# Patient Record
Sex: Female | Born: 1995
Health system: Southern US, Community
[De-identification: ages and names within clinical notes are randomized; demographics above are authoritative.]

## PROBLEM LIST (undated history)

## (undated) DIAGNOSIS — G43909 Migraine, unspecified, not intractable, without status migrainosus: Secondary | ICD-10-CM

## (undated) DIAGNOSIS — D561 Beta thalassemia: Secondary | ICD-10-CM

---

## 2016-06-23 ENCOUNTER — Emergency Department (HOSPITAL_BASED_OUTPATIENT_CLINIC_OR_DEPARTMENT_OTHER): Payer: Medicaid Other

## 2016-06-23 ENCOUNTER — Encounter (HOSPITAL_BASED_OUTPATIENT_CLINIC_OR_DEPARTMENT_OTHER): Payer: Self-pay

## 2016-06-23 ENCOUNTER — Emergency Department (HOSPITAL_BASED_OUTPATIENT_CLINIC_OR_DEPARTMENT_OTHER)
Admission: EM | Admit: 2016-06-23 | Discharge: 2016-06-23 | Disposition: A | Discharge status: Home / Self Care | Payer: Medicaid Other | Attending: Emergency Medicine | Admitting: Emergency Medicine

## 2016-06-23 DIAGNOSIS — R102 Pelvic and perineal pain: Secondary | ICD-10-CM | POA: Diagnosis not present

## 2016-06-23 DIAGNOSIS — O364XX0 Maternal care for intrauterine death, not applicable or unspecified: Secondary | ICD-10-CM | POA: Insufficient documentation

## 2016-06-23 DIAGNOSIS — O26891 Other specified pregnancy related conditions, first trimester: Secondary | ICD-10-CM | POA: Diagnosis not present

## 2016-06-23 DIAGNOSIS — Z3A08 8 weeks gestation of pregnancy: Secondary | ICD-10-CM | POA: Insufficient documentation

## 2016-06-23 DIAGNOSIS — O209 Hemorrhage in early pregnancy, unspecified: Secondary | ICD-10-CM

## 2016-06-23 DIAGNOSIS — IMO0002 Reserved for concepts with insufficient information to code with codable children: Secondary | ICD-10-CM

## 2016-06-23 LAB — HCG, QUANTITATIVE, PREGNANCY: HCG, BETA CHAIN, QUANT, S: 9469 m[IU]/mL — AB (ref <5)

## 2016-06-23 LAB — URINALYSIS, ROUTINE W REFLEX MICROSCOPIC
BILIRUBIN URINE: NEGATIVE
GLUCOSE, UA: NEGATIVE mg/dL
KETONES UR: NEGATIVE mg/dL
Leukocytes, UA: NEGATIVE
NITRITE: NEGATIVE
PH: 7 (ref 5.0–8.0)
PROTEIN: NEGATIVE mg/dL
Specific Gravity, Urine: 1.016 (ref 1.005–1.030)

## 2016-06-23 LAB — URINE MICROSCOPIC-ADD ON: WBC UA: NONE SEEN WBC/hpf (ref 0–5)

## 2016-06-23 LAB — WET PREP, GENITAL
Clue Cells Wet Prep HPF POC: NONE SEEN
Sperm: NONE SEEN
Trich, Wet Prep: NONE SEEN
Yeast Wet Prep HPF POC: NONE SEEN

## 2016-06-23 LAB — ABO/RH: ABO/RH(D): O POS

## 2016-06-23 LAB — PREGNANCY, URINE: Preg Test, Ur: POSITIVE — AB

## 2016-06-23 NOTE — ED Notes (Signed)
MD at bedside discussing results with patient at this time. 

## 2016-06-23 NOTE — ED Notes (Signed)
Patient returned from US.

## 2016-06-23 NOTE — ED Notes (Signed)
MD at bedside. 

## 2016-06-23 NOTE — ED Provider Notes (Addendum)
[facility]   CSN: [id number] Arrival date & time: 06/23/16  1551  First Provider Contact:  First MD Initiated Contact with Patient 06/23/16 1640        History   Chief Complaint Chief Complaint  Patient presents with  . Vaginal Bleeding    HPI Jennifer Walter is a 20 y.o. female.Complains of vaginal bleeding onset 3:30 PM today. She used 1 pad. No other associated symptoms. No pain anywhere. Currently [redacted] weeks pregnant. Followed at [facility] where she has had prenatal care visits thus far. Nothing makes symptoms better or worse.  HPI  History reviewed. No pertinent past medical history. Past medical history thalassemia There are no active problems to display for this patient.   History reviewed. No pertinent surgical history.  OB History    Gravida Para Term Preterm AB Living   1             SAB TAB Ectopic Multiple Live Births                Gravida 2 para 1001 with one term vaginal delivery   Home Medications    Prior to Admission medications   Not on File   Prenatal vitamins Family History No family history on file.  Social History Social History  Substance Use Topics  . Smoking status: Never Smoker  . Smokeless tobacco: Never Used  . Alcohol use No     Allergies   Zithromax [azithromycin]   Review of Systems Review of Systems  Constitutional: Negative.   HENT: Negative.   Respiratory: Negative.   Cardiovascular: Negative.   Gastrointestinal: Negative.   Genitourinary: Positive for vaginal bleeding.       Pregnant  Musculoskeletal: Negative.   Skin: Negative.   Neurological: Negative.   Psychiatric/Behavioral: Negative.   All other systems reviewed and are negative.    Physical Exam Updated Vital Signs BP 123/75 (BP Location: Right Arm)   Pulse 88   Temp 98.1 F (36.7 C) (Oral)   Resp 18   Ht  (1.651 m)   Wt 146 lb (66.2 kg)   SpO2 100%   BMI 24.30 kg/m   Physical Exam  Constitutional:  She appears well-developed and well-nourished.  HENT:  Head: Normocephalic and atraumatic.  Eyes: Conjunctivae are normal. Pupils are equal, round, and reactive to light.  Neck: Neck supple. No tracheal deviation present. No thyromegaly present.  Cardiovascular: Normal rate and regular rhythm.   No murmur heard. Pulmonary/Chest: Effort normal and breath sounds normal.  Abdominal: Soft. Bowel sounds are normal. She exhibits no distension. There is no tenderness.  Genitourinary:  Genitourinary Comments: No external lesion. Cervical os closed minimal dark blood in vaginal vault. No cervical motion tenderness no adnexal masses or tenderness  Musculoskeletal: Normal range of motion. She exhibits no edema or tenderness.  Neurological: She is alert. Coordination normal.  Skin: Skin is warm and dry. No rash noted. She is not diaphoretic.  Psychiatric: She has a normal mood and affect.  Nursing note and vitals reviewed.    ED Treatments / Results  Labs (all labs ordered are listed, but only abnormal results are displayed) Labs Reviewed  PREGNANCY, URINE    EKG  EKG Interpretation None       Radiology No results found.  Procedures Procedures (including critical care time)  Medications Ordered in ED Medications - No data to display  Results for orders placed or performed during the hospital encounter of 06/23/16  Wet  prep, genital  Result Value Ref Range   Yeast Wet Prep HPF POC NONE SEEN NONE SEEN   Trich, Wet Prep NONE SEEN NONE SEEN   Clue Cells Wet Prep HPF POC NONE SEEN NONE SEEN   WBC, Wet Prep HPF POC FEW (A) NONE SEEN   Sperm NONE SEEN   Pregnancy, urine  Result Value Ref Range   Preg Test, Ur POSITIVE (A) NEGATIVE  hCG, quantitative, pregnancy  Result Value Ref Range   hCG, Beta Chain, Quant, S 9,469 (H) <5 mIU/mL  Urinalysis, Routine w reflex microscopic (not at [facility])  Result Value Ref Range   Color, Urine YELLOW YELLOW   APPearance CLEAR CLEAR   Specific  Gravity, Urine 1.016 1.005 - 1.030   pH 7.0 5.0 - 8.0   Glucose, UA NEGATIVE NEGATIVE mg/dL   Hgb urine dipstick MODERATE (A) NEGATIVE   Bilirubin Urine NEGATIVE NEGATIVE   Ketones, ur NEGATIVE NEGATIVE mg/dL   Protein, ur NEGATIVE NEGATIVE mg/dL   Nitrite NEGATIVE NEGATIVE   Leukocytes, UA NEGATIVE NEGATIVE  Urine microscopic-add on  Result Value Ref Range   Squamous Epithelial / LPF 0-5 (A) NONE SEEN   WBC, UA NONE SEEN 0 - 5 WBC/hpf   RBC / HPF 0-5 0 - 5 RBC/hpf   Bacteria, UA RARE (A) NONE SEEN  ABO/Rh  Result Value Ref Range   ABO/RH(D) O POS    No rh immune globuloin      NOT A RH IMMUNE GLOBULIN CANDIDATE, PT RH POSITIVE Performed at [facility]    US Ob Comp Less 14 Wks  Result Date: 06/23/2016 CLINICAL DATA:  Vaginal bleeding for 2 hours. Quantitative beta HCG is 9,469.By LMP patient is 12 weeks 6 days. EDC by LMP is 12/30/2016. EXAM: OBSTETRIC <14 WK [place] AND TRANSVAGINAL OB US TECHNIQUE: Both transabdominal and transvaginal ultrasound examinations were performed for complete evaluation of the gestation as well as the maternal uterus, adnexal regions, and pelvic cul-de-sac. Transvaginal technique was performed to assess early pregnancy. COMPARISON:  None. FINDINGS: Intrauterine gestational sac: Present Yolk sac:  Not seen Embryo:  Present Cardiac Activity: Not seen Heart Rate: Absent  bpm CRL:  19.4  mm   8 w   3 d Subchorionic hemorrhage:  None visualized. Maternal uterus/adnexae: Normal appearance of both ovaries. Small amount of free pelvic fluid noted. IMPRESSION: 1. Absent embryonic cardiac activity with crown-rump length of 19.4 mm. 2. Findings meet definitive criteria for failed pregnancy. This follows SRU consensus guidelines: Diagnostic Criteria for Nonviable Pregnancy Early in the First Trimester. Macy Mis J Med [phone]. Electronically Signed   By: Norva Pavlov M.D.   On: 06/23/2016 19:02   US Ob Transvaginal  Result Date: 06/23/2016 CLINICAL DATA:   Vaginal bleeding for 2 hours. Quantitative beta HCG is 9,469.By LMP patient is 12 weeks 6 days. EDC by LMP is 12/30/2016. EXAM: OBSTETRIC <14 WK [place] AND TRANSVAGINAL OB US TECHNIQUE: Both transabdominal and transvaginal ultrasound examinations were performed for complete evaluation of the gestation as well as the maternal uterus, adnexal regions, and pelvic cul-de-sac. Transvaginal technique was performed to assess early pregnancy. COMPARISON:  None. FINDINGS: Intrauterine gestational sac: Present Yolk sac:  Not seen Embryo:  Present Cardiac Activity: Not seen Heart Rate: Absent  bpm CRL:  19.4  mm   8 w   3 d Subchorionic hemorrhage:  None visualized. Maternal uterus/adnexae: Normal appearance of both ovaries. Small amount of free pelvic fluid noted. IMPRESSION: 1. Absent embryonic cardiac activity with crown-rump  length of 19.4 mm. 2. Findings meet definitive criteria for failed pregnancy. This follows SRU consensus guidelines: Diagnostic Criteria for Nonviable Pregnancy Early in the First Trimester. Macy Mis Engl J Med [id number]. Electronically Signed   By: Norva PavlovElizabeth  Brown M.D.   On: 06/23/2016 19:02   Initial Impression / Assessment and Plan / ED Course  I have reviewed the triage vital signs and the nursing notes.  Pertinent labs & imaging results that were available during my care of the patient were reviewed by me and considered in my medical decision making (see chart for details).  Clinical Course   8:55 PM patient resting comfortably. Asymptomatic. Attempted to call [facility] at [phone] to arrange for follow-up. No answer. I've encouraged patient to call office tomorrow for follow-up. She may need repeat hCG.   Final Clinical Impressions(s) / ED Diagnoses  Diagnosis fetal demise Final diagnoses:  None    New Prescriptions New Prescriptions   No medications on file     Doug SouSam Dameka Younker, MD 06/23/16 2101 Addendum Dr.Brimmage called back after patient's discharge. He will  ensure the patient gets follow-up next week and has repeat hCG   Doug SouSam Issis Lindseth, MD 06/23/16 2114

## 2016-06-23 NOTE — ED Triage Notes (Signed)
Vaginal bleeding x today-[redacted] weeks pregnant-NAD-steady gait

## 2016-06-23 NOTE — ED Notes (Signed)
Patient reports she is [redacted] weeks pregnant, has not yet had her initial appointment but does have one scheduled, with [facility]. Patient states she started bleeding an hour ago, states the blood is dark in color. Patient denies pain.

## 2016-06-23 NOTE — Discharge Instructions (Signed)
Call your OB/GYN doctor tomorrow to schedule a follow-up appointment. You may need further testing to be performed at the office Tell office staff that you were seen here and that the baby has no heartbeat. Return or go to the nearest emergency department if you develop fainting or lightheadedness or severe abdominal pain. Your blood type is O+

## 2016-06-24 LAB — GC/CHLAMYDIA PROBE AMP (~~LOC~~) NOT AT ARMC
Chlamydia: NEGATIVE
Neisseria Gonorrhea: NEGATIVE

## 2016-06-24 LAB — HIV ANTIBODY (ROUTINE TESTING W REFLEX): HIV SCREEN 4TH GENERATION: NONREACTIVE

## 2016-06-24 LAB — RPR: RPR: NONREACTIVE

## 2016-09-11 ENCOUNTER — Encounter: Payer: Self-pay | Admitting: Emergency Medicine

## 2016-09-11 ENCOUNTER — Emergency Department
Admission: EM | Admit: 2016-09-11 | Discharge: 2016-09-11 | Disposition: A | Discharge status: Home / Self Care | Payer: Medicaid Other | Attending: Emergency Medicine | Admitting: Emergency Medicine

## 2016-09-11 DIAGNOSIS — Y92410 Unspecified street and highway as the place of occurrence of the external cause: Secondary | ICD-10-CM | POA: Insufficient documentation

## 2016-09-11 DIAGNOSIS — M791 Myalgia: Secondary | ICD-10-CM | POA: Diagnosis not present

## 2016-09-11 DIAGNOSIS — Y9389 Activity, other specified: Secondary | ICD-10-CM | POA: Insufficient documentation

## 2016-09-11 DIAGNOSIS — S39012A Strain of muscle, fascia and tendon of lower back, initial encounter: Secondary | ICD-10-CM | POA: Diagnosis not present

## 2016-09-11 DIAGNOSIS — S3992XA Unspecified injury of lower back, initial encounter: Secondary | ICD-10-CM | POA: Diagnosis present

## 2016-09-11 DIAGNOSIS — Y999 Unspecified external cause status: Secondary | ICD-10-CM | POA: Insufficient documentation

## 2016-09-11 DIAGNOSIS — M7918 Myalgia, other site: Secondary | ICD-10-CM

## 2016-09-11 MED ORDER — CYCLOBENZAPRINE HCL 5 MG PO TABS: 5.0000 mg | ORAL_TABLET | Freq: Three times a day (TID) | ORAL | 0 refills | Status: DC | PRN

## 2016-09-11 MED ORDER — ACETAMINOPHEN 500 MG PO TABS
1000.0000 mg | ORAL_TABLET | Freq: Once | ORAL | Status: AC
  Administered 2016-09-11: 1000 mg via ORAL
  Filled 2016-09-11: qty 2
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
## 2016-09-11 NOTE — ED Notes (Signed)
Reviewed d/c instructions, follow-up care, use of ice, and prescription with pt. Pt verbalized understanding.
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
## 2016-09-17 ENCOUNTER — Emergency Department: Payer: Medicaid Other

## 2016-09-17 ENCOUNTER — Encounter: Payer: Self-pay | Admitting: Emergency Medicine

## 2016-09-17 ENCOUNTER — Emergency Department
Admission: EM | Admit: 2016-09-17 | Discharge: 2016-09-17 | Disposition: A | Discharge status: Home / Self Care | Payer: Medicaid Other | Attending: Emergency Medicine | Admitting: Emergency Medicine

## 2016-09-17 DIAGNOSIS — M545 Low back pain: Secondary | ICD-10-CM | POA: Insufficient documentation

## 2016-09-17 DIAGNOSIS — S199XXD Unspecified injury of neck, subsequent encounter: Secondary | ICD-10-CM | POA: Diagnosis present

## 2016-09-17 DIAGNOSIS — S161XXD Strain of muscle, fascia and tendon at neck level, subsequent encounter: Secondary | ICD-10-CM | POA: Insufficient documentation

## 2016-09-17 HISTORY — DX: Migraine, unspecified, not intractable, without status migrainosus: G43.909

## 2016-09-17 HISTORY — DX: Beta thalassemia: D56.1

## 2016-09-17 MED ORDER — OXYCODONE-ACETAMINOPHEN 5-325 MG PO TABS: 1.0000 | ORAL_TABLET | Freq: Four times a day (QID) | ORAL | 0 refills | Status: AC | PRN

## 2016-09-17 MED ORDER — PREDNISONE 10 MG (21) PO TBPK: ORAL_TABLET | ORAL | 0 refills | Status: AC

## 2016-09-17 NOTE — Discharge Instructions (Signed)
Take pain medicine as directed. Continue muscle relaxants as needed. Follow-up with the orthopedist if not improving.
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
## 2016-09-17 NOTE — ED Triage Notes (Deleted)
Pt has pain in left wrist.  Pt fell off the step outside today.  Swelling noted to wrist.  Pt alert.
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
## 2016-12-29 ENCOUNTER — Encounter: Payer: Self-pay | Admitting: Emergency Medicine

## 2016-12-29 ENCOUNTER — Other Ambulatory Visit: Payer: Self-pay

## 2016-12-29 ENCOUNTER — Emergency Department: Payer: Medicaid Other

## 2016-12-29 ENCOUNTER — Emergency Department
Admission: EM | Admit: 2016-12-29 | Discharge: 2016-12-29 | Disposition: A | Discharge status: Home / Self Care | Payer: Medicaid Other | Attending: Emergency Medicine | Admitting: Emergency Medicine

## 2016-12-29 DIAGNOSIS — O26891 Other specified pregnancy related conditions, first trimester: Secondary | ICD-10-CM | POA: Insufficient documentation

## 2016-12-29 DIAGNOSIS — R109 Unspecified abdominal pain: Secondary | ICD-10-CM

## 2016-12-29 DIAGNOSIS — R55 Syncope and collapse: Secondary | ICD-10-CM | POA: Insufficient documentation

## 2016-12-29 DIAGNOSIS — Z3A01 Less than 8 weeks gestation of pregnancy: Secondary | ICD-10-CM | POA: Diagnosis not present

## 2016-12-29 DIAGNOSIS — R102 Pelvic and perineal pain: Secondary | ICD-10-CM | POA: Insufficient documentation

## 2016-12-29 DIAGNOSIS — Z79899 Other long term (current) drug therapy: Secondary | ICD-10-CM | POA: Insufficient documentation

## 2016-12-29 LAB — URINALYSIS, COMPLETE (UACMP) WITH MICROSCOPIC
BACTERIA UA: NONE SEEN
BILIRUBIN URINE: NEGATIVE
Glucose, UA: NEGATIVE mg/dL
HGB URINE DIPSTICK: NEGATIVE
Ketones, ur: NEGATIVE mg/dL
LEUKOCYTES UA: NEGATIVE
NITRITE: NEGATIVE
Protein, ur: NEGATIVE mg/dL
SPECIFIC GRAVITY, URINE: 1.017 (ref 1.005–1.030)
pH: 7 (ref 5.0–8.0)

## 2016-12-29 LAB — COMPREHENSIVE METABOLIC PANEL
ALT: 18 U/L (ref 14–54)
ANION GAP: 7 (ref 5–15)
AST: 19 U/L (ref 15–41)
Albumin: 4.1 g/dL (ref 3.5–5.0)
Alkaline Phosphatase: 75 U/L (ref 38–126)
BILIRUBIN TOTAL: 0.7 mg/dL (ref 0.3–1.2)
BUN: 11 mg/dL (ref 6–20)
CO2: 23 mmol/L (ref 22–32)
Calcium: 8.9 mg/dL (ref 8.9–10.3)
Chloride: 106 mmol/L (ref 101–111)
Creatinine, Ser: 0.75 mg/dL (ref 0.44–1.00)
Glucose, Bld: 70 mg/dL (ref 65–99)
POTASSIUM: 3.8 mmol/L (ref 3.5–5.1)
Sodium: 136 mmol/L (ref 135–145)
TOTAL PROTEIN: 7.4 g/dL (ref 6.5–8.1)

## 2016-12-29 LAB — CBC
HEMATOCRIT: 35.5 % (ref 35.0–47.0)
HEMOGLOBIN: 11.8 g/dL — AB (ref 12.0–16.0)
MCH: 22 pg — ABNORMAL LOW (ref 26.0–34.0)
MCHC: 33.1 g/dL (ref 32.0–36.0)
MCV: 66.4 fL — ABNORMAL LOW (ref 80.0–100.0)
Platelets: 256 10*3/uL (ref 150–440)
RBC: 5.35 MIL/uL — AB (ref 3.80–5.20)
RDW: 15.1 % — AB (ref 11.5–14.5)
WBC: 5.7 10*3/uL (ref 3.6–11.0)

## 2016-12-29 LAB — POCT PREGNANCY, URINE: PREG TEST UR: POSITIVE — AB

## 2016-12-29 LAB — HCG, QUANTITATIVE, PREGNANCY: hCG, Beta Chain, Quant, S: 32690 m[IU]/mL — ABNORMAL HIGH (ref <5)

## 2016-12-29 LAB — LIPASE, BLOOD: Lipase: 22 U/L (ref 11–51)

## 2016-12-29 MED ORDER — SODIUM CHLORIDE 0.9 % IV BOLUS (SEPSIS)
1000.0000 mL | Freq: Once | INTRAVENOUS | Status: AC
  Administered 2016-12-29: 1000 mL via INTRAVENOUS

## 2016-12-29 NOTE — ED Provider Notes (Signed)
[facility], [place] Emergency Department Provider Note  ____________________________________________  Time seen: Approximately 5:03 PM  I have reviewed the triage vital signs and the nursing notes.   HISTORY  Chief Complaint Abdominal Pain and Loss of Consciousness   HPI Jennifer Walter is a 21 y.o. female with a history of beta thalassemia who presents for evaluation of abdominal pain and near syncopal episodes. Patient reports for the last week she has had episodes that she feels dizzy like she is going to pass out every time she stands up. Since yesterday she started having cramping mild lower abdominal pain that has been intermittent and nonradiating. Patient is currently breast-feeding her 61-month-old son. She is also on birth control pill. Today she was sent here by her mother to be evaluated. She denies ever having full loss of consciousness. She reports that she stands up feels very dizzy and is able to sit down and her symptoms usually resolve in less than a minute. No chest pain, palpitations, shortness of breath, vaginal discharge, leg pain or swelling. Patient has been eating and drinking fine, no nausea vomiting or diarrhea.  Past Medical History:  Diagnosis Date  . Beta thalassemia (HCC)   . Migraine     There are no active problems to display for this patient.   History reviewed. No pertinent surgical history.  Prior to Admission medications   Medication Sig Start Date End Date Taking? Authorizing Provider  cyclobenzaprine (FLEXERIL) 5 MG tablet Take 1 tablet (5 mg total) by mouth 3 (three) times daily as needed for muscle spasms. 09/11/16   Jenise V Bacon Menshew, PA-C  oxyCODONE-acetaminophen (ROXICET) 5-325 MG tablet Take 1 tablet by mouth every 6 (six) hours as needed. 09/17/16   Ignacia Bayley, PA-C  predniSONE (STERAPRED UNI-PAK 21 TAB) 10 MG (21) TBPK tablet 6 tablets on day 1, 5 tablets on day 2, 4 tablets on day 3, etc... 09/17/16   Ignacia Bayley, PA-C    Allergies Zithromax [azithromycin]  No family history on file.  Social History Social History  Substance Use Topics  . Smoking status: Never Smoker  . Smokeless tobacco: Never Used  . Alcohol use No    Review of Systems  Constitutional: Negative for fever. + Lightheadedness Eyes: Negative for visual changes. ENT: Negative for sore throat. Neck: No neck pain  Cardiovascular: Negative for chest pain. Respiratory: Negative for shortness of breath. Gastrointestinal: + lower abdominal pain. No vomiting or diarrhea. Genitourinary: Negative for dysuria. Musculoskeletal: Negative for back pain. Skin: Negative for rash. Neurological: Negative for headaches, weakness or numbness. Psych: No SI or HI  ____________________________________________   PHYSICAL EXAM:  VITAL SIGNS: ED Triage Vitals  Enc Vitals Group     BP 12/29/16 1539 111/71     Pulse Rate 12/29/16 1539 91     Resp 12/29/16 1539 18     Temp 12/29/16 1539 99 F (37.2 C)     Temp Source 12/29/16 1539 Oral     SpO2 12/29/16 1539 99 %     Weight 12/29/16 1355 153 lb (69.4 kg)     Height 12/29/16 1355 5\' 5"  (1.651 m)     Head Circumference --      Peak Flow --      Pain Score 12/29/16 1356 5     Pain Loc --      Pain Edu? --      Excl. in GC? --     Constitutional: Alert and oriented. Well appearing and in no  apparent distress. HEENT:      Head: Normocephalic and atraumatic.         Eyes: Conjunctivae are normal. Sclera is non-icteric. EOMI. PERRL      Mouth/Throat: Mucous membranes are moist.       Neck: Supple with no signs of meningismus. Cardiovascular: Regular rate and rhythm. No murmurs, gallops, or rubs. 2+ symmetrical distal pulses are present in all extremities. No JVD. Respiratory: Normal respiratory effort. Lungs are clear to auscultation bilaterally. No wheezes, crackles, or rhonchi.  Gastrointestinal: Soft, non tender, and non distended with positive bowel sounds. No rebound or  guarding. Genitourinary: No CVA tenderness. Musculoskeletal: Nontender with normal range of motion in all extremities. No edema, cyanosis, or erythema of extremities. Neurologic: Normal speech and language. Face is symmetric. Moving all extremities. No gross focal neurologic deficits are appreciated. Skin: Skin is warm, dry and intact. No rash noted. Psychiatric: Mood and affect are normal. Speech and behavior are normal.  ____________________________________________   LABS (all labs ordered are listed, but only abnormal results are displayed)  Labs Reviewed  CBC - Abnormal; Notable for the following:       Result Value   RBC 5.35 (*)    Hemoglobin 11.8 (*)    MCV 66.4 (*)    MCH 22.0 (*)    RDW 15.1 (*)    All other components within normal limits  URINALYSIS, COMPLETE (UACMP) WITH MICROSCOPIC - Abnormal; Notable for the following:    Color, Urine YELLOW (*)    APPearance CLEAR (*)    Squamous Epithelial / LPF 0-5 (*)    All other components within normal limits  POCT PREGNANCY, URINE - Abnormal; Notable for the following:    Preg Test, Ur POSITIVE (*)    All other components within normal limits  LIPASE, BLOOD  COMPREHENSIVE METABOLIC PANEL  HCG, QUANTITATIVE, PREGNANCY  POC URINE PREG, ED   ____________________________________________  EKG  ED ECG REPORT I, Nita Sicklearolina Davaun Quintela, the attending physician, personally viewed and interpreted this ECG.  Normal sinus rhythm, normal intervals, normal axis, no STE or depressions, no evidence of HOCM, AV block, delta wave, ARVD, prolonged QTc, WPW. ____________________________________________  RADIOLOGY  TV US: 1. Single intrauterine gestational sac with yolk sac at 6 weeks 2 days by mean sac diameter. No embryo detected, potentially due to early gestational age. Tiny perigestational bleed. Recommend follow-up US in 11-14 days for definitive assessment of pregnancy viability. This recommendation follows SRU consensus  guidelines: Diagnostic Criteria for Nonviable Pregnancy Early in the First Trimester. Malva Limes Engl J Med 2013; 045:4098-11; 369:1443-51. 2. No suspicious ovarian or adnexal findings. ____________________________________________   PROCEDURES  Procedure(s) performed: None Procedures Critical Care performed:  None ____________________________________________   INITIAL IMPRESSION / ASSESSMENT AND PLAN / ED COURSE  21 y.o. female with a history of beta thalassemia who presents for evaluation of abdominal pain and near syncopal episodes. Pregnancy test positive. Vitals and PE with no acute findings. Labs WNl with stable hgb. Will get EKG, monitor on telemetry, TV US to rule out ectopic. Will give IVF.    ED COURSE:  Transvaginal ultrasound confirming single IUP gestational sac with no embryo detected possibly from early gestational age. They recommended follow-up ultrasound in 2 weeks. Blood work with no acute findings. EKG within normal limits. Patient be discharged home to follow up with OB/GYN for prenatal care.  Pertinent labs & imaging results that were available during my care of the patient were reviewed by me and considered in my medical decision making (  see chart for details).    ____________________________________________   FINAL CLINICAL IMPRESSION(S) / ED DIAGNOSES  Final diagnoses:  None      NEW MEDICATIONS STARTED DURING THIS VISIT:  New Prescriptions   No medications on file     Note:  This document was prepared using Dragon voice recognition software and may include unintentional dictation errors.    Nita Sickle, MD 12/29/16 1800

## 2016-12-29 NOTE — ED Triage Notes (Signed)
sayps passing out and stomach pains in low abd on and off for 1 week.  Says she may be pregnant.

## 2016-12-29 NOTE — ED Notes (Signed)
Pt presents with lower abdominal pain x 1 week. Pt states she has been "passing out" also - 3 times in last week. Pt alert & oriented with NAD noted.

## 2016-12-29 NOTE — ED Notes (Signed)
Pt discharged home after verbalizing understanding of discharge instructions; nad noted. 

## 2017-07-08 ENCOUNTER — Emergency Department (HOSPITAL_COMMUNITY)
Admission: EM | Admit: 2017-07-08 | Discharge: 2017-07-09 | Discharge status: Against Medical Advice | Payer: Medicaid Other | Source: Home / Self Care

## 2017-07-08 ENCOUNTER — Encounter (HOSPITAL_COMMUNITY): Payer: Self-pay | Admitting: Emergency Medicine

## 2017-07-08 DIAGNOSIS — W2210XA Striking against or struck by unspecified automobile airbag, initial encounter: Secondary | ICD-10-CM | POA: Insufficient documentation

## 2017-07-08 DIAGNOSIS — S060X0A Concussion without loss of consciousness, initial encounter: Secondary | ICD-10-CM | POA: Insufficient documentation

## 2017-07-08 DIAGNOSIS — Y998 Other external cause status: Secondary | ICD-10-CM | POA: Insufficient documentation

## 2017-07-08 DIAGNOSIS — Y92410 Unspecified street and highway as the place of occurrence of the external cause: Secondary | ICD-10-CM | POA: Insufficient documentation

## 2017-07-08 DIAGNOSIS — Y9389 Activity, other specified: Secondary | ICD-10-CM | POA: Insufficient documentation

## 2017-07-08 DIAGNOSIS — Z5321 Procedure and treatment not carried out due to patient leaving prior to being seen by health care provider: Secondary | ICD-10-CM

## 2017-07-08 DIAGNOSIS — Z041 Encounter for examination and observation following transport accident: Secondary | ICD-10-CM | POA: Diagnosis present
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
## 2017-07-09 ENCOUNTER — Emergency Department (HOSPITAL_BASED_OUTPATIENT_CLINIC_OR_DEPARTMENT_OTHER)
Admission: EM | Admit: 2017-07-09 | Discharge: 2017-07-09 | Disposition: A | Discharge status: Home / Self Care | Payer: Medicaid Other | Attending: Emergency Medicine | Admitting: Emergency Medicine

## 2017-07-09 ENCOUNTER — Encounter (HOSPITAL_BASED_OUTPATIENT_CLINIC_OR_DEPARTMENT_OTHER): Payer: Self-pay | Admitting: *Deleted

## 2017-07-09 DIAGNOSIS — S060X0A Concussion without loss of consciousness, initial encounter: Secondary | ICD-10-CM

## 2017-07-09 MED ORDER — ONDANSETRON 4 MG PO TBDP: 4.0000 mg | ORAL_TABLET | Freq: Three times a day (TID) | ORAL | 0 refills | Status: AC | PRN

## 2017-07-09 MED ORDER — METHOCARBAMOL 500 MG PO TABS: 500.0000 mg | ORAL_TABLET | Freq: Two times a day (BID) | ORAL | 0 refills | Status: AC

## 2017-07-09 MED ORDER — IBUPROFEN 600 MG PO TABS: 600.0000 mg | ORAL_TABLET | Freq: Four times a day (QID) | ORAL | 0 refills | Status: AC | PRN

## 2017-07-09 MED ORDER — LIDOCAINE 5 % EX PTCH: 1.0000 | MEDICATED_PATCH | CUTANEOUS | 0 refills | Status: AC

## 2017-07-09 MED FILL — METHOCARBAMOL 500 MG TABLET: 500 | 10 days supply | Qty: 20 | Fill #0

## 2017-07-09 MED FILL — IBUPROFEN 600 MG TABLET: 600 | 8 days supply | Qty: 30 | Fill #0

## 2017-07-09 MED FILL — ONDANSETRON ODT 4 MG TABLET: 4 | 6 days supply | Qty: 18 | Fill #0
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
## 2017-07-09 NOTE — ED Notes (Signed)
She was getting some snacks for her son when she did not answer.

## 2017-07-09 NOTE — ED Notes (Signed)
No answer

## 2017-09-28 ENCOUNTER — Encounter: Payer: Medicaid Other | Admitting: Certified Nurse Midwife

## 2018-12-06 IMAGING — US US OB TRANSVAGINAL
1 series · 13 of 28 positions shown · non-contrast
Comparison: No prior scans from this gestation.

CLINICAL DATA: 20-year-old pregnant female with 2 weeks of lower
rectal pain, dizziness and headaches. Irregular menstrual cycles.

EDC by estimated LMP: 06/26/2017, projecting to an expected
gestational age of 14 weeks 3 days.
EXAM:
OBSTETRIC <14 WK US AND TRANSVAGINAL OB US
TECHNIQUE: Both transabdominal and transvaginal ultrasound examinations were
performed for complete evaluation of the gestation as well as the
maternal uterus, adnexal regions, and pelvic cul-de-sac.
Transvaginal technique was performed to assess early pregnancy.

[Series 1: us ob transvaginal · 0.12mm/px · 13 of 129 slices shown]
[im 5/129]
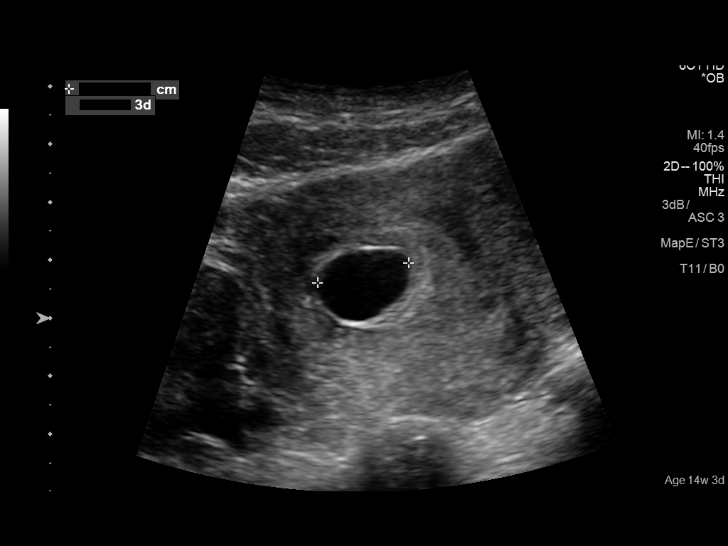
[im 15/129]
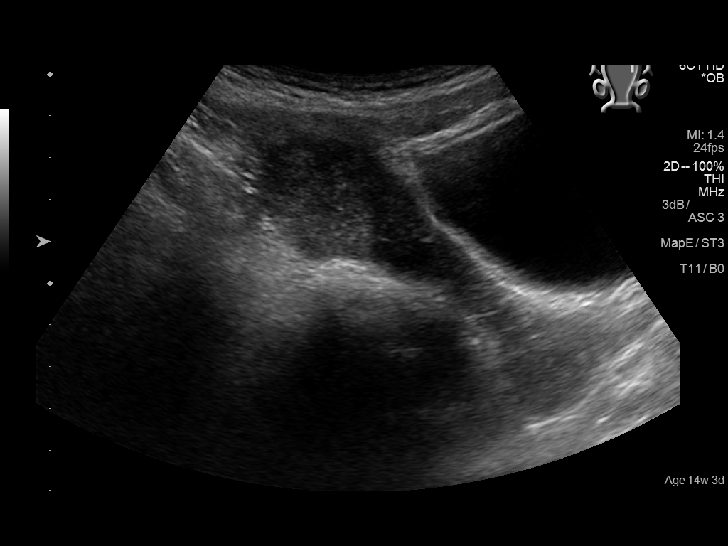
[im 24/129]
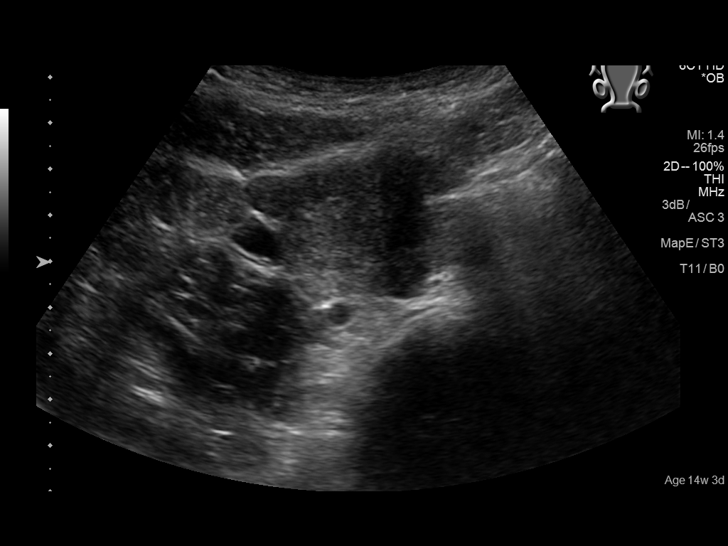
[im 34/129]
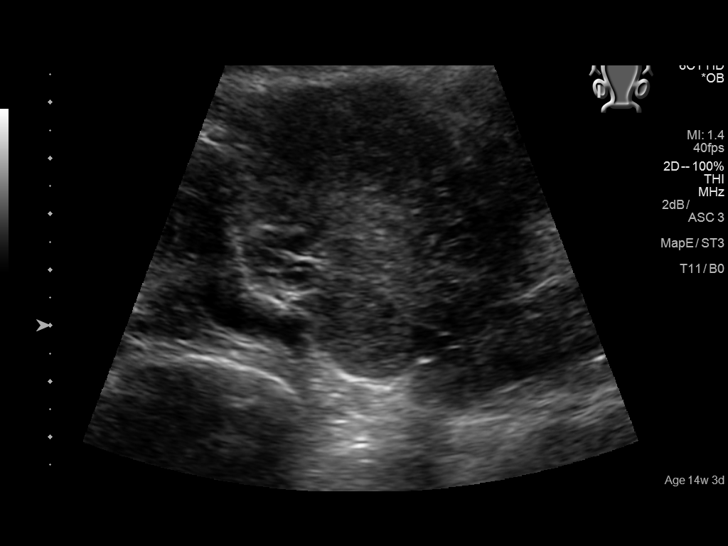
[im 43/129]
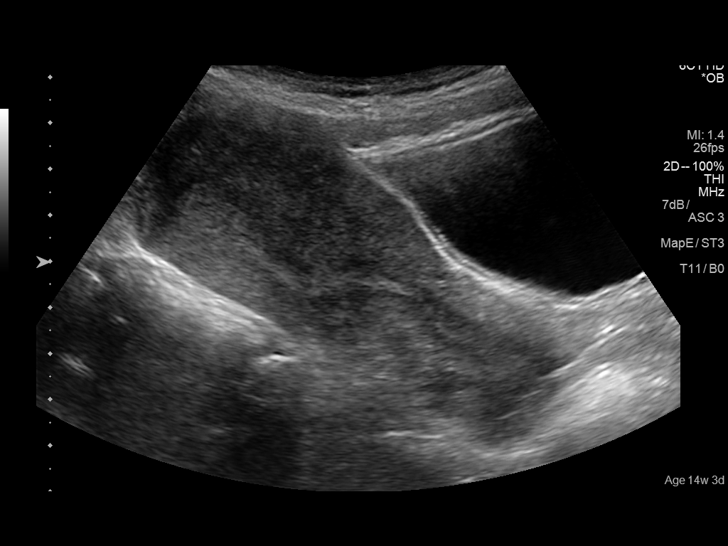
[im 53/129]
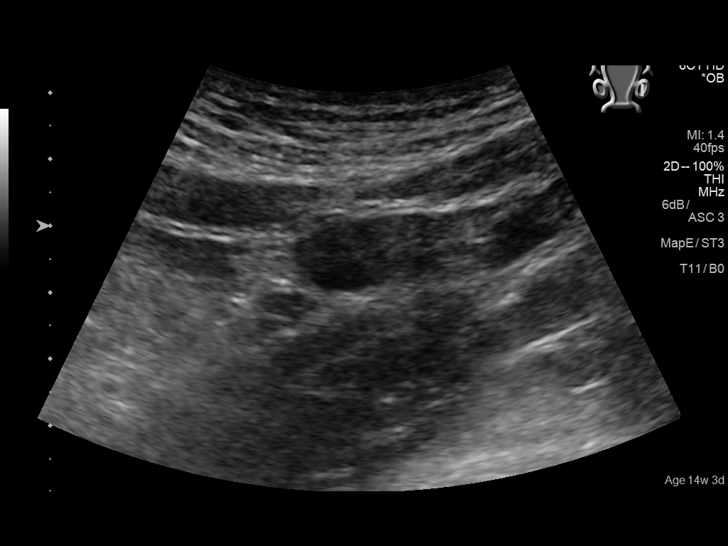
[im 67/129]
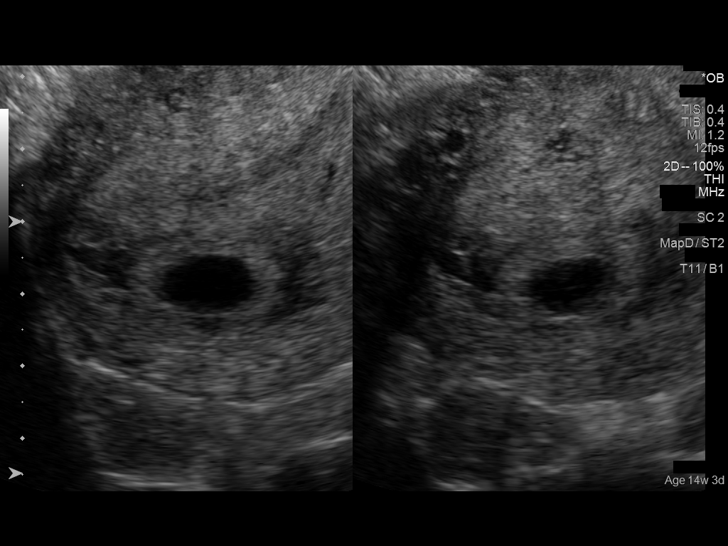
[im 76/129]
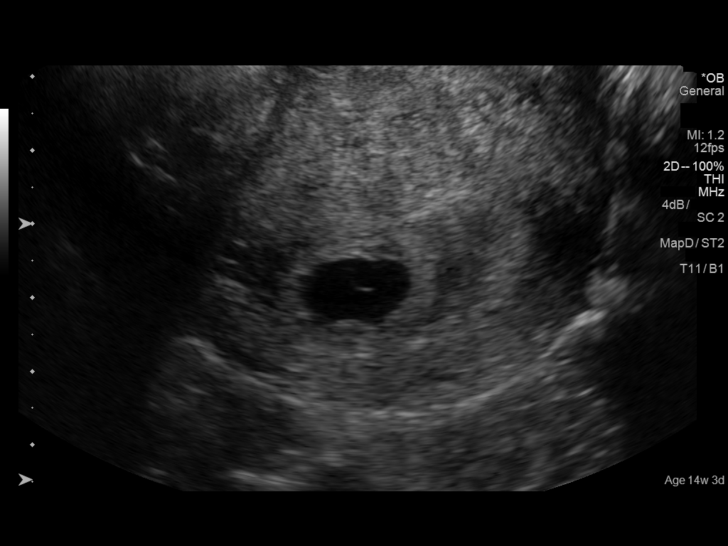
[im 86/129]
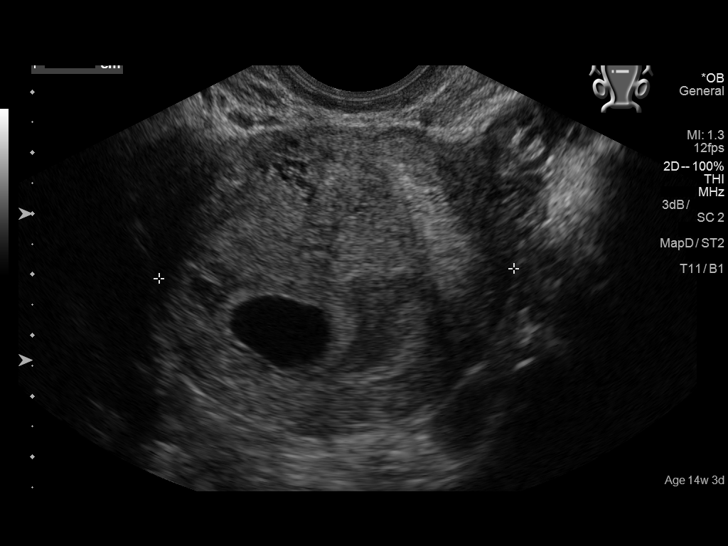
[im 95/129]
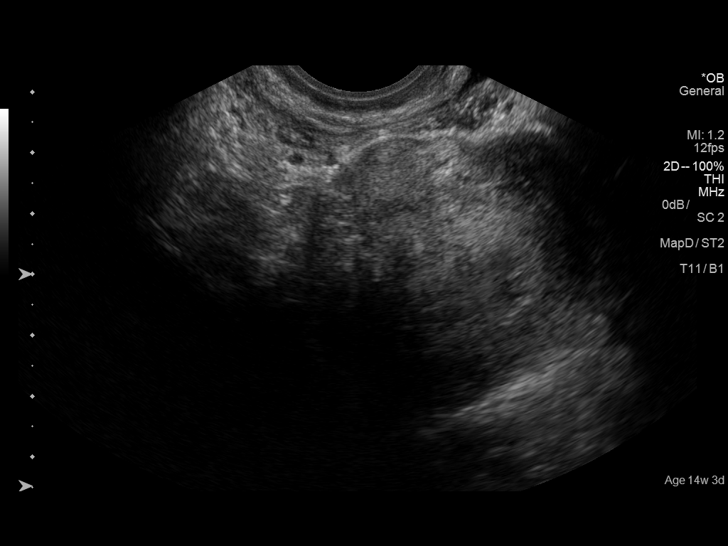
[im 105/129]
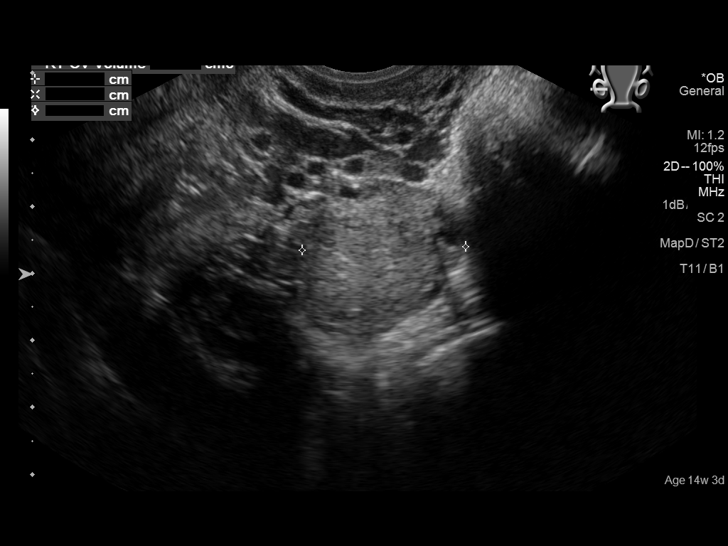
[im 114/129]
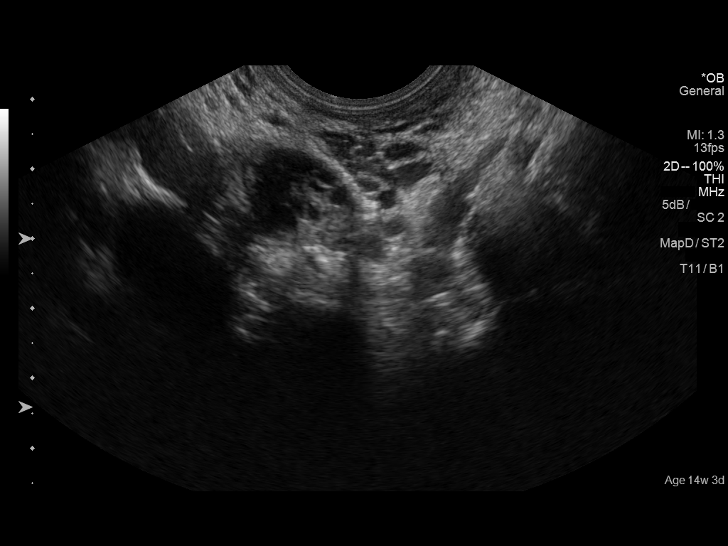
[im 124/129]
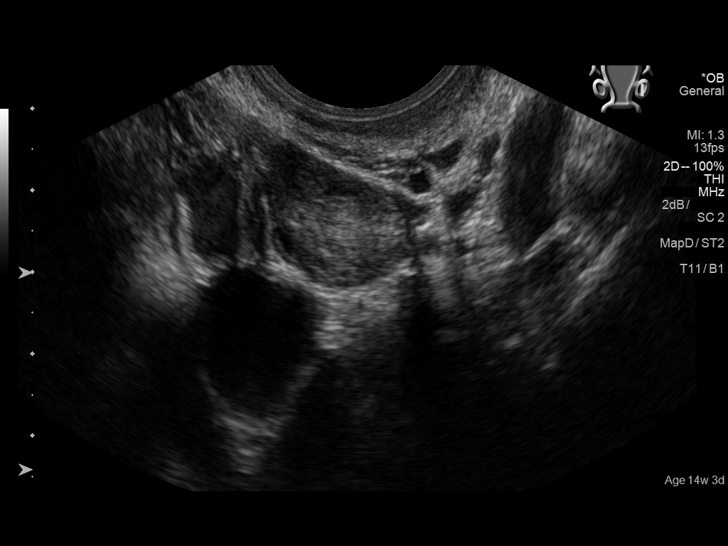

[13 of 28 positions shown; findings below may reference images not displayed]

FINDINGS: Intrauterine gestational sac: Single intrauterine gestational sac
appears normal in shape and position.

Yolk sac:  Visualized.

Embryo:  Not Visualized.

Embryonic Cardiac Activity: Not Visualized.

MSD: 15.1  mm   6 w   2  d

Subchorionic hemorrhage: Tiny perigestational bleed in the upper
cavity measuring 0.9 x 0.6 x 0.8 cm, involving less than 20% of the
gestational sac circumference.

Maternal uterus/adnexae: Anteverted uterus. No uterine fibroids. No
abnormal free fluid in the pelvis. Right ovary measures 3.2 x 2.4 x
2.4 cm and contains an isoechoic 2.3 x 1.5 x 2.1 cm structure with
peripheral vascularity compatible with a corpus luteum. Left ovary
measures 2.6 x 1.5 x 1.7 cm. No abnormal ovarian or adnexal masses.
IMPRESSION: 1. Single intrauterine gestational sac with yolk sac at 6 weeks 2
days by mean sac diameter. No embryo detected, potentially due to
early gestational age. Tiny perigestational bleed. Recommend
follow-up US in 11-14 days for definitive assessment of pregnancy
viability. This recommendation follows SRU consensus guidelines:
Diagnostic Criteria for Nonviable Pregnancy Early in the First
Trimester. N Engl J Med 2250; [DATE].
2. No suspicious ovarian or adnexal findings.
